# Patient Record
Sex: Male | Born: 2000 | Race: White | Hispanic: No | Marital: Single | Smoking: Never smoker
Health system: Southern US, Community
[De-identification: ages and names within clinical notes are randomized; demographics above are authoritative.]

## PROBLEM LIST (undated history)

## (undated) DIAGNOSIS — F319 Bipolar disorder, unspecified: Secondary | ICD-10-CM

## (undated) DIAGNOSIS — R51 Headache: Secondary | ICD-10-CM

## (undated) DIAGNOSIS — F909 Attention-deficit hyperactivity disorder, unspecified type: Secondary | ICD-10-CM

## (undated) DIAGNOSIS — F329 Major depressive disorder, single episode, unspecified: Secondary | ICD-10-CM

## (undated) DIAGNOSIS — R110 Nausea: Secondary | ICD-10-CM

## (undated) DIAGNOSIS — J302 Other seasonal allergic rhinitis: Secondary | ICD-10-CM

## (undated) HISTORY — DX: Attention-deficit hyperactivity disorder, unspecified type: F90.9

## (undated) HISTORY — PX: TONSILLECTOMY: SUR1361

## (undated) HISTORY — DX: Bipolar disorder, unspecified: F31.9

## (undated) HISTORY — PX: CIRCUMCISION: SUR203

## (undated) HISTORY — DX: Headache: R51

## (undated) HISTORY — DX: Nausea: R11.0

## (undated) HISTORY — DX: Major depressive disorder, single episode, unspecified: F32.9

## (undated) HISTORY — DX: Other seasonal allergic rhinitis: J30.2

---
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
## 2022-01-16 ENCOUNTER — Telehealth (HOSPITAL_COMMUNITY): Payer: Medicaid Other | Admitting: Medical

## 2022-01-17 ENCOUNTER — Telehealth (HOSPITAL_COMMUNITY): Payer: Medicaid Other | Admitting: Medical

## 2022-01-21 ENCOUNTER — Encounter (HOSPITAL_COMMUNITY): Payer: Self-pay | Admitting: Medical

## 2022-01-21 ENCOUNTER — Telehealth (HOSPITAL_BASED_OUTPATIENT_CLINIC_OR_DEPARTMENT_OTHER): Payer: Medicaid Other | Admitting: Medical

## 2022-01-21 DIAGNOSIS — S0502XS Injury of conjunctiva and corneal abrasion without foreign body, left eye, sequela: Secondary | ICD-10-CM

## 2022-01-21 DIAGNOSIS — F819 Developmental disorder of scholastic skills, unspecified: Secondary | ICD-10-CM

## 2022-01-21 DIAGNOSIS — T7412XS Child physical abuse, confirmed, sequela: Secondary | ICD-10-CM

## 2022-01-21 DIAGNOSIS — F5104 Psychophysiologic insomnia: Secondary | ICD-10-CM | POA: Diagnosis not present

## 2022-01-21 DIAGNOSIS — R454 Irritability and anger: Secondary | ICD-10-CM

## 2022-01-21 DIAGNOSIS — Z811 Family history of alcohol abuse and dependence: Secondary | ICD-10-CM

## 2022-01-21 DIAGNOSIS — F39 Unspecified mood [affective] disorder: Secondary | ICD-10-CM

## 2022-01-21 DIAGNOSIS — F422 Mixed obsessional thoughts and acts: Secondary | ICD-10-CM

## 2022-01-21 DIAGNOSIS — H9325 Central auditory processing disorder: Secondary | ICD-10-CM

## 2022-01-21 DIAGNOSIS — S0501XS Injury of conjunctiva and corneal abrasion without foreign body, right eye, sequela: Secondary | ICD-10-CM

## 2022-01-21 DIAGNOSIS — Z6372 Alcoholism and drug addiction in family: Secondary | ICD-10-CM

## 2022-01-21 DIAGNOSIS — F419 Anxiety disorder, unspecified: Secondary | ICD-10-CM

## 2022-01-21 DIAGNOSIS — G43009 Migraine without aura, not intractable, without status migrainosus: Secondary | ICD-10-CM

## 2022-01-21 DIAGNOSIS — Z8709 Personal history of other diseases of the respiratory system: Secondary | ICD-10-CM

## 2022-01-21 MED ORDER — CLONIDINE HCL 0.3 MG PO TABS: ORAL_TABLET | ORAL | 0 refills | Status: DC

## 2022-01-21 MED ORDER — FLUOXETINE HCL 40 MG PO CAPS: 80.0000 mg | ORAL_CAPSULE | Freq: Every day | ORAL | 0 refills | Status: DC
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
## 2022-07-25 ENCOUNTER — Telehealth (HOSPITAL_BASED_OUTPATIENT_CLINIC_OR_DEPARTMENT_OTHER): Payer: Medicaid Other | Admitting: Medical

## 2022-07-25 ENCOUNTER — Encounter (HOSPITAL_COMMUNITY): Payer: Self-pay | Admitting: Medical

## 2022-07-25 DIAGNOSIS — F39 Unspecified mood [affective] disorder: Secondary | ICD-10-CM

## 2022-07-25 DIAGNOSIS — F819 Developmental disorder of scholastic skills, unspecified: Secondary | ICD-10-CM

## 2022-07-25 DIAGNOSIS — F422 Mixed obsessional thoughts and acts: Secondary | ICD-10-CM | POA: Diagnosis not present

## 2022-07-25 DIAGNOSIS — T7412XS Child physical abuse, confirmed, sequela: Secondary | ICD-10-CM

## 2022-07-25 DIAGNOSIS — Z6372 Alcoholism and drug addiction in family: Secondary | ICD-10-CM

## 2022-07-25 DIAGNOSIS — S0501XS Injury of conjunctiva and corneal abrasion without foreign body, right eye, sequela: Secondary | ICD-10-CM

## 2022-07-25 DIAGNOSIS — Z8709 Personal history of other diseases of the respiratory system: Secondary | ICD-10-CM

## 2022-07-25 DIAGNOSIS — F5104 Psychophysiologic insomnia: Secondary | ICD-10-CM | POA: Diagnosis not present

## 2022-07-25 DIAGNOSIS — H9325 Central auditory processing disorder: Secondary | ICD-10-CM

## 2022-07-25 DIAGNOSIS — G43009 Migraine without aura, not intractable, without status migrainosus: Secondary | ICD-10-CM

## 2022-07-25 DIAGNOSIS — R454 Irritability and anger: Secondary | ICD-10-CM | POA: Diagnosis not present

## 2022-07-25 DIAGNOSIS — Z811 Family history of alcohol abuse and dependence: Secondary | ICD-10-CM
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
## 2022-07-31 MED ORDER — CLONIDINE HCL 0.3 MG PO TABS: ORAL_TABLET | ORAL | 0 refills | Status: DC

## 2022-07-31 MED ORDER — FLUOXETINE HCL 40 MG PO CAPS: 80.0000 mg | ORAL_CAPSULE | Freq: Every day | ORAL | 0 refills | Status: DC

## 2022-12-23 ENCOUNTER — Other Ambulatory Visit (HOSPITAL_COMMUNITY): Payer: Self-pay | Admitting: Medical

## 2022-12-23 ENCOUNTER — Telehealth (HOSPITAL_COMMUNITY): Payer: Self-pay | Admitting: *Deleted

## 2022-12-23 MED ORDER — FLUOXETINE HCL 40 MG PO CAPS: 80.0000 mg | ORAL_CAPSULE | Freq: Every day | ORAL | 0 refills | Status: DC

## 2022-12-23 NOTE — Telephone Encounter (Signed)
Mother called requesting refill of Prozac 40 mg, 2 caps QD. Rx expired on 10/29/22. Pt has an appointment scheduled on 01/23/23. Please review.

## 2023-01-23 ENCOUNTER — Telehealth (HOSPITAL_BASED_OUTPATIENT_CLINIC_OR_DEPARTMENT_OTHER): Payer: Medicaid Other | Admitting: Medical

## 2023-01-23 ENCOUNTER — Encounter (HOSPITAL_COMMUNITY): Payer: Self-pay | Admitting: Medical

## 2023-01-23 DIAGNOSIS — F819 Developmental disorder of scholastic skills, unspecified: Secondary | ICD-10-CM

## 2023-01-23 DIAGNOSIS — F419 Anxiety disorder, unspecified: Secondary | ICD-10-CM

## 2023-01-23 DIAGNOSIS — H9325 Central auditory processing disorder: Secondary | ICD-10-CM

## 2023-01-23 DIAGNOSIS — F5104 Psychophysiologic insomnia: Secondary | ICD-10-CM | POA: Diagnosis not present

## 2023-01-23 DIAGNOSIS — R454 Irritability and anger: Secondary | ICD-10-CM | POA: Diagnosis not present

## 2023-01-23 DIAGNOSIS — Z8709 Personal history of other diseases of the respiratory system: Secondary | ICD-10-CM

## 2023-01-23 DIAGNOSIS — Z811 Family history of alcohol abuse and dependence: Secondary | ICD-10-CM

## 2023-01-23 DIAGNOSIS — F422 Mixed obsessional thoughts and acts: Secondary | ICD-10-CM

## 2023-01-23 DIAGNOSIS — F39 Unspecified mood [affective] disorder: Secondary | ICD-10-CM

## 2023-01-23 DIAGNOSIS — S0501XS Injury of conjunctiva and corneal abrasion without foreign body, right eye, sequela: Secondary | ICD-10-CM

## 2023-01-23 DIAGNOSIS — T7432XS Child psychological abuse, confirmed, sequela: Secondary | ICD-10-CM

## 2023-01-23 DIAGNOSIS — S0502XS Injury of conjunctiva and corneal abrasion without foreign body, left eye, sequela: Secondary | ICD-10-CM

## 2023-01-23 DIAGNOSIS — G43009 Migraine without aura, not intractable, without status migrainosus: Secondary | ICD-10-CM

## 2023-01-23 DIAGNOSIS — Z6372 Alcoholism and drug addiction in family: Secondary | ICD-10-CM

## 2023-01-23 MED ORDER — FLUOXETINE HCL 40 MG PO CAPS: 80.0000 mg | ORAL_CAPSULE | Freq: Every day | ORAL | 1 refills | Status: AC

## 2023-01-23 MED ORDER — CLONIDINE HCL 0.3 MG PO TABS: ORAL_TABLET | ORAL | 1 refills | Status: AC
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
## 2023-01-29 ENCOUNTER — Other Ambulatory Visit (HOSPITAL_COMMUNITY): Payer: Self-pay | Admitting: Medical

## 2023-01-30 NOTE — Telephone Encounter (Signed)
RX 6/20 at FU

## 2023-07-24 ENCOUNTER — Encounter (HOSPITAL_COMMUNITY): Payer: Self-pay | Admitting: Medical

## 2023-07-24 ENCOUNTER — Telehealth (HOSPITAL_COMMUNITY): Payer: Medicaid Other | Admitting: Medical

## 2023-07-24 DIAGNOSIS — Z811 Family history of alcohol abuse and dependence: Secondary | ICD-10-CM

## 2023-07-24 DIAGNOSIS — Z8709 Personal history of other diseases of the respiratory system: Secondary | ICD-10-CM

## 2023-07-24 DIAGNOSIS — T7412XS Child physical abuse, confirmed, sequela: Secondary | ICD-10-CM

## 2023-07-24 DIAGNOSIS — H9325 Central auditory processing disorder: Secondary | ICD-10-CM

## 2023-07-24 DIAGNOSIS — F419 Anxiety disorder, unspecified: Secondary | ICD-10-CM

## 2023-07-24 DIAGNOSIS — F819 Developmental disorder of scholastic skills, unspecified: Secondary | ICD-10-CM

## 2023-07-24 NOTE — Progress Notes (Signed)
Patient ID: Peter [name], male   DOB: Sep 09, 2000, 22 y.o.   MRN: [id number]  Peter [name] 's Mom received his My Chart with permission to inform me that Volvi is at work and that he has stopped taking medications in a effort to live his life free of the need for them. He is 22 yrs old now and has started working at an [employer]. Mom reports that he has anxiety still but not to the extent that it is interfering with his relationships/ability to function. He has been doing research on his own about anxiety.  I did suggest the posssibility of CBT to assist him with this and Mom wants to discuss it with him and she will let me know if he is interested in a referral ?.

## 2023-07-25 ENCOUNTER — Telehealth (HOSPITAL_COMMUNITY): Payer: Self-pay | Admitting: Medical

## 2023-07-25 ENCOUNTER — Encounter (HOSPITAL_COMMUNITY): Payer: Self-pay | Admitting: Medical

## 2023-07-25 NOTE — Telephone Encounter (Signed)
Per provider, patient has been dismissed from the practice. A dismissal letter has been sent via certified mail on 07/25/2023.

## 2023-08-20 ENCOUNTER — Other Ambulatory Visit (HOSPITAL_COMMUNITY): Payer: Self-pay | Admitting: Medical
